# Patient Record
Sex: Female | Born: 1965
Health system: Southern US, Community
[De-identification: ages and names within clinical notes are randomized; demographics above are authoritative.]

## PROBLEM LIST (undated history)

## (undated) DIAGNOSIS — D069 Carcinoma in situ of cervix, unspecified: Secondary | ICD-10-CM

## (undated) HISTORY — DX: Carcinoma in situ of cervix, unspecified: D06.9

---

## 2008-02-26 ENCOUNTER — Emergency Department (HOSPITAL_COMMUNITY): Admission: EM | Admit: 2008-02-26 | Discharge: 2008-02-26 | Payer: Self-pay | Admitting: Family Medicine

## 2008-03-08 ENCOUNTER — Inpatient Hospital Stay (HOSPITAL_COMMUNITY): Admission: AD | Admit: 2008-03-08 | Discharge: 2008-03-08 | Payer: Self-pay | Admitting: Obstetrics & Gynecology

## 2008-08-01 ENCOUNTER — Ambulatory Visit (HOSPITAL_COMMUNITY): Admission: RE | Admit: 2008-08-01 | Discharge: 2008-08-01 | Payer: Self-pay | Admitting: Obstetrics & Gynecology

## 2008-09-15 ENCOUNTER — Ambulatory Visit (HOSPITAL_COMMUNITY): Admission: RE | Admit: 2008-09-15 | Discharge: 2008-09-15 | Payer: Self-pay | Admitting: Family Medicine

## 2008-10-17 ENCOUNTER — Inpatient Hospital Stay (HOSPITAL_COMMUNITY): Admission: AD | Admit: 2008-10-17 | Discharge: 2008-10-19 | Payer: Self-pay | Admitting: Gynecology

## 2008-10-17 ENCOUNTER — Ambulatory Visit: Payer: Self-pay | Admitting: Physician Assistant

## 2010-11-21 ENCOUNTER — Encounter: Payer: Self-pay | Admitting: *Deleted

## 2011-07-26 LAB — POCT PREGNANCY, URINE
Operator id: 239701
Preg Test, Ur: POSITIVE

## 2011-08-05 LAB — CBC
HCT: 38.8 % (ref 36.0–46.0)
HCT: 42.2 % (ref 36.0–46.0)
Hemoglobin: 14.4 g/dL (ref 12.0–15.0)
MCHC: 34.4 g/dL (ref 30.0–36.0)
Platelets: 152 10*3/uL (ref 150–400)
Platelets: 173 10*3/uL (ref 150–400)
RDW: 14.7 % (ref 11.5–15.5)

## 2015-04-07 ENCOUNTER — Other Ambulatory Visit: Payer: Self-pay

## 2015-04-07 DIAGNOSIS — Z1231 Encounter for screening mammogram for malignant neoplasm of breast: Secondary | ICD-10-CM

## 2015-04-10 ENCOUNTER — Ambulatory Visit
Admission: RE | Admit: 2015-04-10 | Discharge: 2015-04-10 | Disposition: A | Discharge status: Home / Self Care | Payer: BLUE CROSS/BLUE SHIELD | Source: Ambulatory Visit

## 2015-04-10 DIAGNOSIS — Z1231 Encounter for screening mammogram for malignant neoplasm of breast: Secondary | ICD-10-CM

## 2015-06-01 HISTORY — PX: CERVICAL BIOPSY  W/ LOOP ELECTRODE EXCISION: SUR135

## 2016-03-14 DIAGNOSIS — R5383 Other fatigue: Secondary | ICD-10-CM | POA: Diagnosis not present

## 2016-03-14 DIAGNOSIS — Z1389 Encounter for screening for other disorder: Secondary | ICD-10-CM | POA: Diagnosis not present

## 2016-03-14 DIAGNOSIS — Z0000 Encounter for general adult medical examination without abnormal findings: Secondary | ICD-10-CM | POA: Diagnosis not present

## 2016-03-14 DIAGNOSIS — E559 Vitamin D deficiency, unspecified: Secondary | ICD-10-CM | POA: Diagnosis not present

## 2016-03-14 DIAGNOSIS — R0602 Shortness of breath: Secondary | ICD-10-CM | POA: Diagnosis not present

## 2016-03-17 DIAGNOSIS — R3129 Other microscopic hematuria: Secondary | ICD-10-CM | POA: Diagnosis not present

## 2016-03-17 DIAGNOSIS — E559 Vitamin D deficiency, unspecified: Secondary | ICD-10-CM | POA: Diagnosis not present

## 2016-04-18 ENCOUNTER — Encounter: Payer: Self-pay | Admitting: Obstetrics & Gynecology

## 2016-04-18 DIAGNOSIS — Z1231 Encounter for screening mammogram for malignant neoplasm of breast: Secondary | ICD-10-CM | POA: Diagnosis not present

## 2016-04-25 DIAGNOSIS — Z681 Body mass index (BMI) 19 or less, adult: Secondary | ICD-10-CM | POA: Diagnosis not present

## 2016-04-25 DIAGNOSIS — R87610 Atypical squamous cells of undetermined significance on cytologic smear of cervix (ASC-US): Secondary | ICD-10-CM | POA: Diagnosis not present

## 2016-04-25 DIAGNOSIS — Z1151 Encounter for screening for human papillomavirus (HPV): Secondary | ICD-10-CM | POA: Diagnosis not present

## 2016-04-25 DIAGNOSIS — Z01419 Encounter for gynecological examination (general) (routine) without abnormal findings: Secondary | ICD-10-CM | POA: Diagnosis not present

## 2017-05-10 ENCOUNTER — Encounter: Payer: Self-pay | Admitting: Obstetrics & Gynecology

## 2017-05-10 ENCOUNTER — Ambulatory Visit (INDEPENDENT_AMBULATORY_CARE_PROVIDER_SITE_OTHER): Payer: BLUE CROSS/BLUE SHIELD | Admitting: Obstetrics & Gynecology

## 2017-05-10 VITALS — Ht 58.25 in | Wt 101.0 lb

## 2017-05-10 DIAGNOSIS — Z1151 Encounter for screening for human papillomavirus (HPV): Secondary | ICD-10-CM

## 2017-05-10 DIAGNOSIS — Z01419 Encounter for gynecological examination (general) (routine) without abnormal findings: Secondary | ICD-10-CM | POA: Diagnosis not present

## 2017-05-10 DIAGNOSIS — Z87410 Personal history of cervical dysplasia: Secondary | ICD-10-CM | POA: Diagnosis not present

## 2017-05-10 DIAGNOSIS — Z789 Other specified health status: Secondary | ICD-10-CM

## 2017-05-10 NOTE — Patient Instructions (Signed)
1. Encounter for routine gynecological examination with Papanicolaou smear of cervix Normal gyn exam.  Pap/HPV HR done.  Breasts wnl.  [facility]  2. History of cervical dysplasia LEEP Margins neg 06/2015.  Pap normal/HPV HR neg 03/2016.  3. Uses condoms for contraception Declines alternatives.  Sarah Oliver, it was a pleasure to see you today!  I will inform you of your results as soon as available.

## 2017-05-10 NOTE — Progress Notes (Signed)
Sarah Oliver May 24, 1966 [id number]   History:    51 y.o.  G23P2A2  Married.  2 girls, 49 yo and 65+ yo.  Interpret present.  RP:  Established patient presenting for annual gyn exam   HPI:  Doing well.  Menses reg normal every month.  Using condoms. No pelvic pain.  Normal vaginal secretions.  H/O LEEP for CIN 3, but pap 03/2016 normal/HPV HR neg.  Breasts wnl.  Mictions/BMs wnl.  Past medical history,surgical history, family history and social history were all reviewed and documented in the EPIC chart.  Gynecologic History No LMP recorded. Contraception: condoms Last Pap: 03/2016. Results were: Normal/HPV HR neg Last mammogram: 03/2016. Results were: normal  Obstetric History OB History  Gravida Para Term Preterm AB Living  4 2     2 2   SAB TAB Ectopic Multiple Live Births               # Outcome Date GA Lbr Len/2nd Weight Sex Delivery Anes PTL Lv  4 AB           3 AB           2 Para           1 Para                ROS: A ROS was performed and pertinent positives and negatives are included in the history.  GENERAL: No fevers or chills. HEENT: No change in vision, no earache, sore throat or sinus congestion. NECK: No pain or stiffness. CARDIOVASCULAR: No chest pain or pressure. No palpitations. PULMONARY: No shortness of breath, cough or wheeze. GASTROINTESTINAL: No abdominal pain, nausea, vomiting or diarrhea, melena or bright red blood per rectum. GENITOURINARY: No urinary frequency, urgency, hesitancy or dysuria. MUSCULOSKELETAL: No joint or muscle pain, no back pain, no recent trauma. DERMATOLOGIC: No rash, no itching, no lesions. ENDOCRINE: No polyuria, polydipsia, no heat or cold intolerance. No recent change in weight. HEMATOLOGICAL: No anemia or easy bruising or bleeding. NEUROLOGIC: No headache, seizures, numbness, tingling or weakness. PSYCHIATRIC: No depression, no loss of interest in normal activity or change in sleep pattern.     Exam:   Ht 4' 10.25" (1.48 m)   Wt  101 lb (45.8 kg)   BMI 20.93 kg/m   Body mass index is 20.93 kg/m.  General appearance : Well developed well nourished female. No acute distress HEENT: Eyes: no retinal hemorrhage or exudates,  Neck supple, trachea midline, no carotid bruits, no thyroidmegaly Lungs: Clear to auscultation, no rhonchi or wheezes, or rib retractions  Heart: Regular rate and rhythm, no murmurs or gallops Breast:Examined in sitting and supine position were symmetrical in appearance, no palpable masses or tenderness,  no skin retraction, no nipple inversion, no nipple discharge, no skin discoloration, no axillary or supraclavicular lymphadenopathy Abdomen: no palpable masses or tenderness, no rebound or guarding Extremities: no edema or skin discoloration or tenderness  Pelvic:  Bartholin, Urethra, Skene Glands: Within normal limits             Vagina: No gross lesions or discharge  Cervix: No gross lesions or discharge.  Pap/HPV HR done.  Uterus  AV, normal size, shape and consistency, non-tender and mobile  Adnexa  Without masses or tenderness  Anus and perineum  normal    Assessment/Plan:  51 y.o. female for annual exam   1. Encounter for routine gynecological examination with Papanicolaou smear of cervix Normal gyn exam.  Pap/HPV HR done.  Breasts  wnl.  [facility]  2. History of cervical dysplasia LEEP Margins neg 06/2015.  Pap normal/HPV HR neg 03/2016.  3. Uses condoms for contraception Declines alternatives.   Genia DelMarie-Lyne Ivannah Zody MD, 4:43 PM 05/10/2017

## 2017-05-10 NOTE — Addendum Note (Signed)
Addended by: Berna SpareASTILLO, Jasminne Mealy A on: 05/10/2017 05:10 PM   Modules accepted: Orders

## 2017-05-11 DIAGNOSIS — Z01419 Encounter for gynecological examination (general) (routine) without abnormal findings: Secondary | ICD-10-CM | POA: Diagnosis not present

## 2017-05-11 DIAGNOSIS — Z1151 Encounter for screening for human papillomavirus (HPV): Secondary | ICD-10-CM | POA: Diagnosis not present

## 2017-05-15 ENCOUNTER — Other Ambulatory Visit: Payer: Self-pay | Admitting: Obstetrics & Gynecology

## 2017-05-15 DIAGNOSIS — Z1231 Encounter for screening mammogram for malignant neoplasm of breast: Secondary | ICD-10-CM

## 2017-05-15 LAB — PAP, TP IMAGING W/ HPV RNA, RFLX HPV TYPE 16,18/45: HPV MRNA, HIGH RISK: NOT DETECTED

## 2017-05-19 ENCOUNTER — Encounter: Payer: Self-pay | Admitting: *Deleted

## 2017-05-31 ENCOUNTER — Ambulatory Visit
Admission: RE | Admit: 2017-05-31 | Discharge: 2017-05-31 | Disposition: A | Discharge status: Home / Self Care | Payer: BLUE CROSS/BLUE SHIELD | Source: Ambulatory Visit | Attending: Obstetrics & Gynecology | Admitting: Obstetrics & Gynecology

## 2017-05-31 DIAGNOSIS — Z1231 Encounter for screening mammogram for malignant neoplasm of breast: Secondary | ICD-10-CM

## 2018-05-08 ENCOUNTER — Other Ambulatory Visit: Payer: Self-pay | Admitting: Obstetrics & Gynecology

## 2018-05-08 DIAGNOSIS — Z1231 Encounter for screening mammogram for malignant neoplasm of breast: Secondary | ICD-10-CM

## 2018-06-05 ENCOUNTER — Ambulatory Visit
Admission: RE | Admit: 2018-06-05 | Discharge: 2018-06-05 | Disposition: A | Discharge status: Home / Self Care | Payer: BLUE CROSS/BLUE SHIELD | Source: Ambulatory Visit | Attending: Obstetrics & Gynecology | Admitting: Obstetrics & Gynecology

## 2018-06-05 DIAGNOSIS — Z1231 Encounter for screening mammogram for malignant neoplasm of breast: Secondary | ICD-10-CM | POA: Diagnosis not present

## 2018-06-06 ENCOUNTER — Other Ambulatory Visit: Payer: Self-pay | Admitting: Obstetrics & Gynecology

## 2018-06-06 DIAGNOSIS — R928 Other abnormal and inconclusive findings on diagnostic imaging of breast: Secondary | ICD-10-CM

## 2018-06-11 ENCOUNTER — Ambulatory Visit
Admission: RE | Admit: 2018-06-11 | Discharge: 2018-06-11 | Disposition: A | Discharge status: Home / Self Care | Payer: BLUE CROSS/BLUE SHIELD | Source: Ambulatory Visit | Attending: Obstetrics & Gynecology | Admitting: Obstetrics & Gynecology

## 2018-06-11 DIAGNOSIS — R928 Other abnormal and inconclusive findings on diagnostic imaging of breast: Secondary | ICD-10-CM

## 2018-06-11 DIAGNOSIS — N6489 Other specified disorders of breast: Secondary | ICD-10-CM | POA: Diagnosis not present

## 2018-06-11 DIAGNOSIS — R922 Inconclusive mammogram: Secondary | ICD-10-CM | POA: Diagnosis not present

## 2018-07-23 ENCOUNTER — Encounter: Payer: BLUE CROSS/BLUE SHIELD | Admitting: Obstetrics & Gynecology

## 2018-08-28 ENCOUNTER — Encounter: Payer: BLUE CROSS/BLUE SHIELD | Admitting: Obstetrics & Gynecology

## 2018-10-01 IMAGING — MG DIGITAL DIAGNOSTIC UNILATERAL LEFT MAMMOGRAM WITH TOMO AND CAD
4 series · 4 of 12 positions shown · non-contrast
Comparison: Previous exam(s).

CLINICAL DATA: Patient was called back from screening mammogram for
possible asymmetry in the left breast.

EXAM:
DIGITAL DIAGNOSTIC LEFT MAMMOGRAM WITH TOMO
ULTRASOUND LEFT BREAST

[L CC synth-2D]
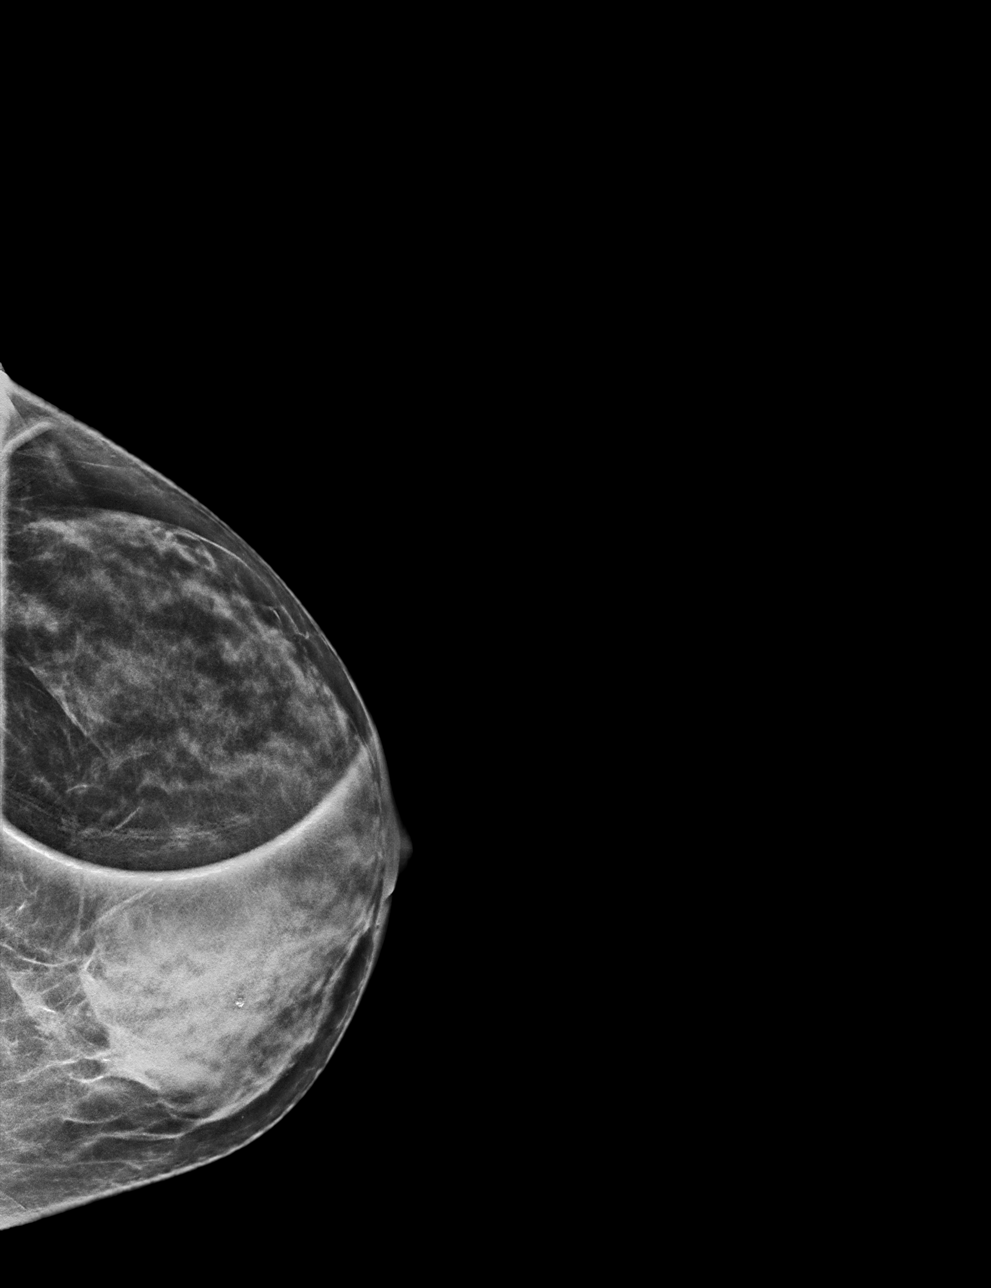

[L MLO synth-2D]
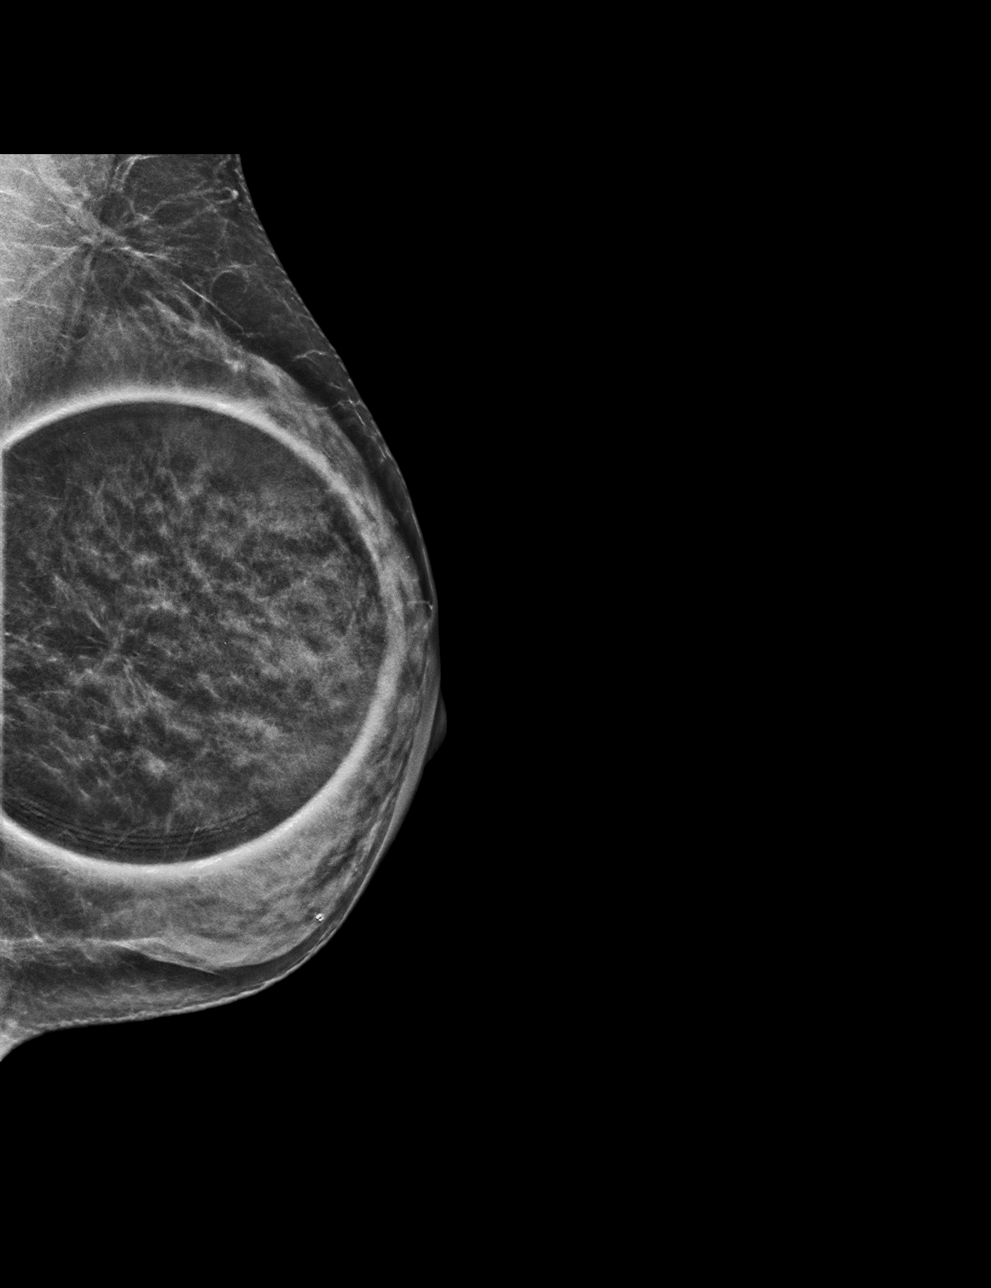

[L CC tomo · tomo slice 25/50.0]
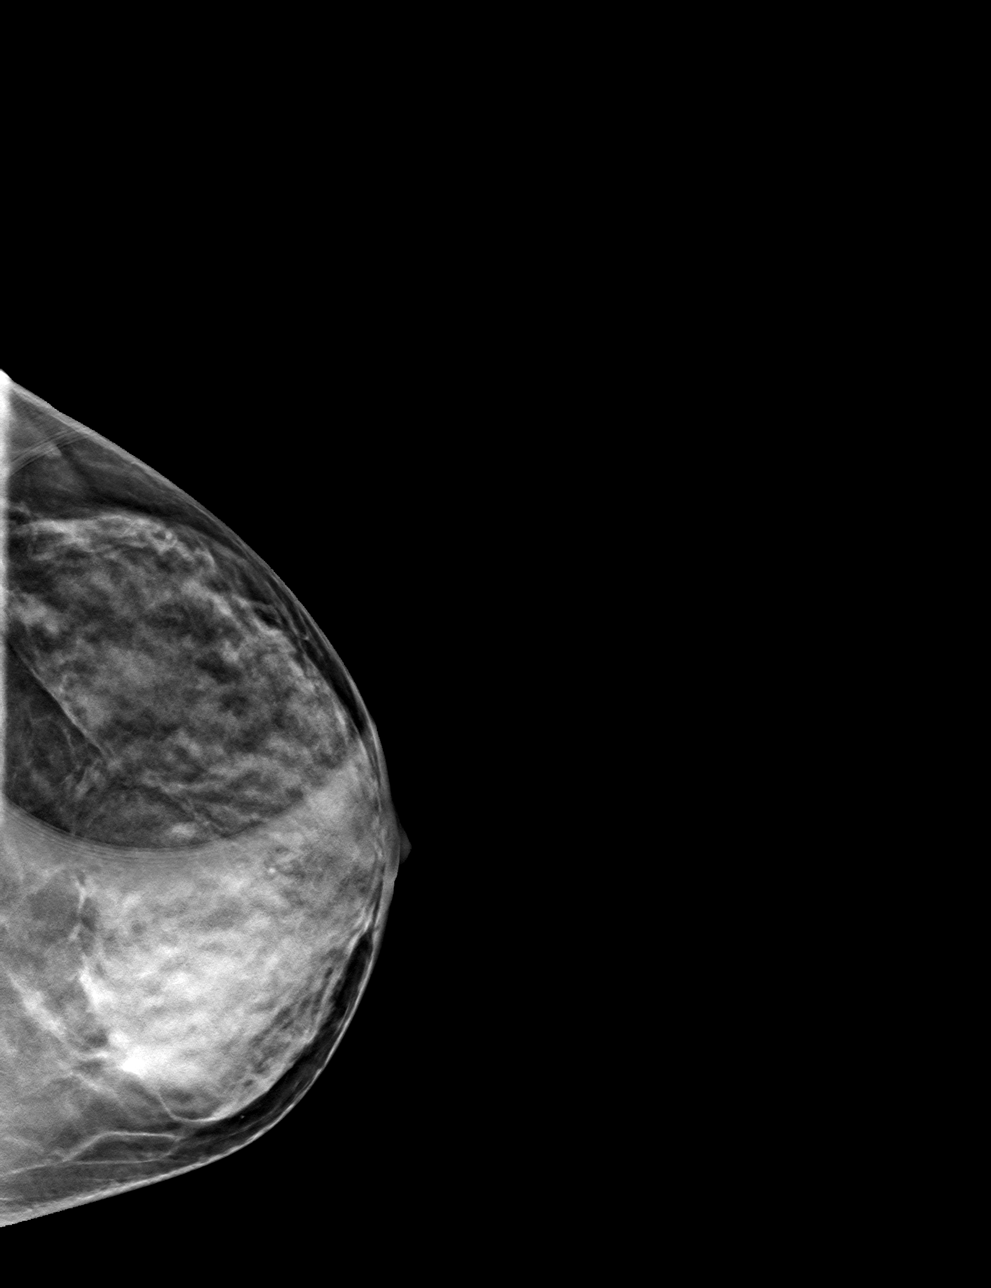

[L MLO tomo · tomo slice 22/43.0]
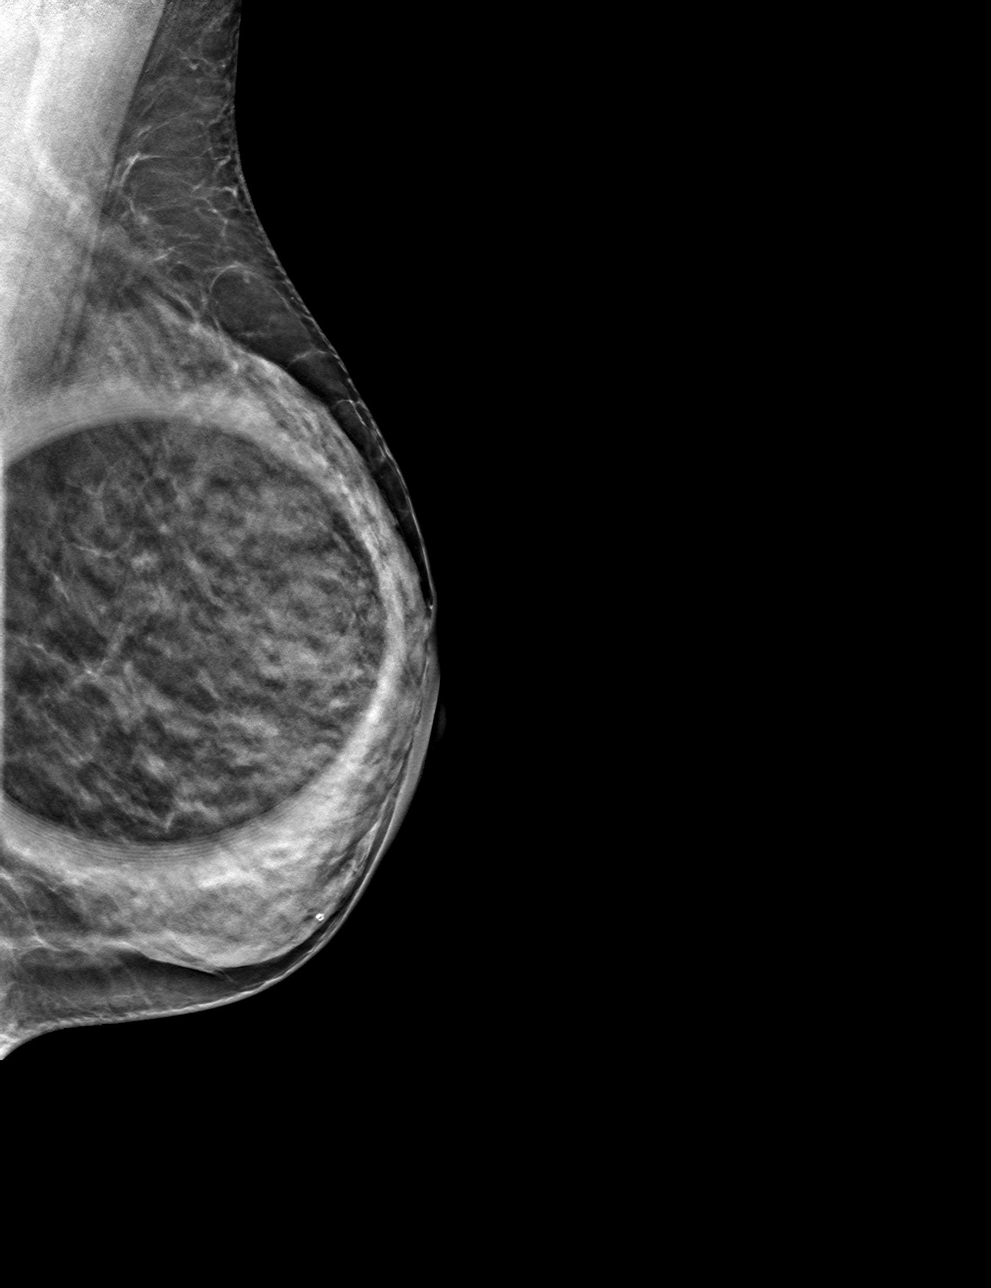

[4 of 12 positions shown; findings below may reference images not displayed]

ACR Breast Density Category c: The breast tissue is heterogeneously
dense, which may obscure small masses.
FINDINGS: Additional imaging of the left breast was performed. No persistent
mass, distortion or malignant type microcalcifications identified.

Targeted ultrasound is performed, showing normal tissue in the
lateral aspect of the left breast. No solid or cystic mass, abnormal
shadowing or distortion visualized.
IMPRESSION: No evidence of malignancy in the left breast.

RECOMMENDATION:
Bilateral screening mammogram in 1 year is recommended.

I have discussed the findings and recommendations with the patient.
Results were also provided in writing at the conclusion of the
visit. If applicable, a reminder letter will be sent to the patient
regarding the next appointment.

BI-RADS CATEGORY  1: Negative.

## 2018-11-23 ENCOUNTER — Encounter: Payer: Self-pay | Admitting: Obstetrics & Gynecology

## 2018-11-23 ENCOUNTER — Telehealth: Payer: Self-pay | Admitting: *Deleted

## 2018-11-23 ENCOUNTER — Ambulatory Visit (INDEPENDENT_AMBULATORY_CARE_PROVIDER_SITE_OTHER): Payer: BLUE CROSS/BLUE SHIELD | Admitting: Obstetrics & Gynecology

## 2018-11-23 VITALS — BP 104/68 | Ht 58.5 in | Wt 103.0 lb

## 2018-11-23 DIAGNOSIS — Z01419 Encounter for gynecological examination (general) (routine) without abnormal findings: Secondary | ICD-10-CM | POA: Diagnosis not present

## 2018-11-23 DIAGNOSIS — Z3049 Encounter for surveillance of other contraceptives: Secondary | ICD-10-CM | POA: Diagnosis not present

## 2018-11-23 DIAGNOSIS — Z1211 Encounter for screening for malignant neoplasm of colon: Secondary | ICD-10-CM

## 2018-11-23 NOTE — Progress Notes (Signed)
Avon Molock 02/16/1966 [id number]   History:    53 y.o. G10P2A2L2 Married.  2 daughters, 26 and 76 yo.  RP:  Established patient presenting for annual gyn exam   HPI: Menstrual.'s every months with normal flow.  No breakthrough bleeding.  No pelvic pain.  No pain with intercourse.  Normal vaginal secretions.  H/O LEEP.  Urine and bowel movements normal.  Breasts normal.  Body mass index 21.16.  Healthy nutrition.  Practices yoga.  We will follow-up here for fasting health labs.  Past medical history,surgical history, family history and social history were all reviewed and documented in the EPIC chart.  Gynecologic History Patient's last menstrual period was 11/05/2018. Contraception: condoms Last Pap: 04/2017. Results were: Negative/HPV HR neg Last mammogram: 05/2018. Results were: Negative Bone Density:  Never Colonoscopy: Will refer to Gastro  Obstetric History OB History  Gravida Para Term Preterm AB Living  _0 SAB TAB Ectopic Multiple Live Births               # Outcome Date GA Lbr Len/2nd Weight Sex Delivery Anes PTL Lv  4 AB           3 AB           2 Para           1 Para              ROS: A ROS was performed and pertinent positives and negatives are included in the history.  GENERAL: No fevers or chills. HEENT: No change in vision, no earache, sore throat or sinus congestion. NECK: No pain or stiffness. CARDIOVASCULAR: No chest pain or pressure. No palpitations. PULMONARY: No shortness of breath, cough or wheeze. GASTROINTESTINAL: No abdominal pain, nausea, vomiting or diarrhea, melena or bright red blood per rectum. GENITOURINARY: No urinary frequency, urgency, hesitancy or dysuria. MUSCULOSKELETAL: No joint or muscle pain, no back pain, no recent trauma. DERMATOLOGIC: No rash, no itching, no lesions. ENDOCRINE: No polyuria, polydipsia, no heat or cold intolerance. No recent change in weight. HEMATOLOGICAL: No anemia or easy bruising or bleeding. NEUROLOGIC: No  headache, seizures, numbness, tingling or weakness. PSYCHIATRIC: No depression, no loss of interest in normal activity or change in sleep pattern.     Exam:   BP 104/68   Ht 4' 10.5" (1.486 m)   Wt 103 lb (46.7 kg)   LMP 11/05/2018   BMI 21.16 kg/m   Body mass index is 21.16 kg/m.  General appearance : Well developed well nourished female. No acute distress HEENT: Eyes: no retinal hemorrhage or exudates,  Neck supple, trachea midline, no carotid bruits, no thyroidmegaly Lungs: Clear to auscultation, no rhonchi or wheezes, or rib retractions  Heart: Regular rate and rhythm, no murmurs or gallops Breast:Examined in sitting and supine position were symmetrical in appearance, no palpable masses or tenderness,  no skin retraction, no nipple inversion, no nipple discharge, no skin discoloration, no axillary or supraclavicular lymphadenopathy Abdomen: no palpable masses or tenderness, no rebound or guarding Extremities: no edema or skin discoloration or tenderness  Pelvic: Vulva: Normal             Vagina: No gross lesions or discharge  Cervix: No gross lesions or discharge.  Pap reflex done.  Uterus  AV, normal size, shape and consistency, non-tender and mobile  Adnexa  Without masses or tenderness  Anus: Normal   Assessment/Plan:  53 y.o. female for annual exam  1. Encounter for routine gynecological examination with Papanicolaou smear of cervix Normal gynecologic exam.  Pap reflex done.  Breast exam normal.  Screening mammogram August 2019 was negative.  Will refer to gastro for colonoscopy.  Follow-up here for fasting health labs.  Good body mass index at 21.16.  Healthy nutrition.  Practicing yoga. - CBC; Future - Comp Met (CMET); Future - TSH; Future - Lipid panel; Future - VITAMIN D 25 Hydroxy (Vit-D Deficiency, Fractures); Future  2. Encounter for surveillance of condom contraception Will continue to use condoms for contraception.  Declines alternative  contraception.  Other orders - OVER THE COUNTER MEDICATION; Bone and joint health  Princess Bruins MD, 8:22 AM 11/23/2018

## 2018-11-23 NOTE — Telephone Encounter (Signed)
-----   Message from Genia Del, MD sent at 11/23/2018  8:44 AM EST ----- Regarding: Refer to [facility] Screening Colonoscopy

## 2018-11-23 NOTE — Patient Instructions (Signed)
1. Encounter for routine gynecological examination with Papanicolaou smear of cervix Normal gynecologic exam.  Pap reflex done.  Breast exam normal.  Screening mammogram August 2019 was negative.  Will refer to gastro for colonoscopy.  Follow-up here for fasting health labs.  Good body mass index at 21.16.  Healthy nutrition.  Practicing yoga. - CBC; Future - Comp Met (CMET); Future - TSH; Future - Lipid panel; Future - VITAMIN D 25 Hydroxy (Vit-D Deficiency, Fractures); Future  2. Encounter for surveillance of condom contraception Will continue to use condoms for contraception.  Declines alternative contraception.  Other orders - OVER THE COUNTER MEDICATION; Bone and joint health  Sarah Oliver, it was a pleasure seeing you today!  I will inform you of your results as soon as they are available.

## 2018-11-23 NOTE — Addendum Note (Signed)
Addended by: Damek Ende A on: 11/23/2018 10:19 AM   Modules accepted: Orders  

## 2018-11-23 NOTE — Telephone Encounter (Signed)
Referral placed in epic at [facility] they will call to schedule.  

## 2018-11-28 ENCOUNTER — Other Ambulatory Visit: Payer: BLUE CROSS/BLUE SHIELD

## 2018-11-28 DIAGNOSIS — Z01419 Encounter for gynecological examination (general) (routine) without abnormal findings: Secondary | ICD-10-CM | POA: Diagnosis not present

## 2018-11-28 LAB — PAP IG W/ RFLX HPV ASCU

## 2018-11-29 LAB — COMPREHENSIVE METABOLIC PANEL
AG RATIO: 1.6 (calc) (ref 1.0–2.5)
ALT: 9 U/L (ref 6–29)
AST: 14 U/L (ref 10–35)
Albumin: 4.4 g/dL (ref 3.6–5.1)
Alkaline phosphatase (APISO): 31 U/L — ABNORMAL LOW (ref 33–130)
BILIRUBIN TOTAL: 0.6 mg/dL (ref 0.2–1.2)
BUN: 14 mg/dL (ref 7–25)
CALCIUM: 9.6 mg/dL (ref 8.6–10.4)
CO2: 25 mmol/L (ref 20–32)
Chloride: 104 mmol/L (ref 98–110)
Creat: 0.52 mg/dL (ref 0.50–1.05)
GLUCOSE: 96 mg/dL (ref 65–99)
Globulin: 2.7 g/dL (calc) (ref 1.9–3.7)
Potassium: 4 mmol/L (ref 3.5–5.3)
Sodium: 141 mmol/L (ref 135–146)
Total Protein: 7.1 g/dL (ref 6.1–8.1)

## 2018-11-29 LAB — CBC
HCT: 42 % (ref 35.0–45.0)
HEMOGLOBIN: 14.5 g/dL (ref 11.7–15.5)
MCH: 31.4 pg (ref 27.0–33.0)
MCHC: 34.5 g/dL (ref 32.0–36.0)
MCV: 90.9 fL (ref 80.0–100.0)
MPV: 12.4 fL (ref 7.5–12.5)
Platelets: 183 10*3/uL (ref 140–400)
RBC: 4.62 10*6/uL (ref 3.80–5.10)
RDW: 11.8 % (ref 11.0–15.0)
WBC: 6 10*3/uL (ref 3.8–10.8)

## 2018-11-29 LAB — LIPID PANEL
CHOLESTEROL: 190 mg/dL (ref <200)
HDL: 75 mg/dL (ref >50)
LDL Cholesterol (Calc): 99 mg/dL (calc)
Non-HDL Cholesterol (Calc): 115 mg/dL (calc) (ref <130)
Total CHOL/HDL Ratio: 2.5 (calc) (ref <5.0)
Triglycerides: 74 mg/dL (ref <150)

## 2018-11-29 LAB — VITAMIN D 25 HYDROXY (VIT D DEFICIENCY, FRACTURES): Vit D, 25-Hydroxy: 32 ng/mL (ref 30–100)

## 2018-11-29 LAB — TSH: TSH: 1.02 mIU/L

## 2018-12-10 NOTE — Telephone Encounter (Signed)
Patient will call back to schedule per [facility]

## 2019-11-27 ENCOUNTER — Encounter: Payer: BLUE CROSS/BLUE SHIELD | Admitting: Obstetrics & Gynecology

## 2019-11-27 DIAGNOSIS — Z0289 Encounter for other administrative examinations: Secondary | ICD-10-CM
